# Patient Record
Sex: Female | Born: 1965
Health system: Southern US, Community
[De-identification: ages and names within clinical notes are randomized; demographics above are authoritative.]

---

## 2013-07-15 ENCOUNTER — Ambulatory Visit: Payer: Self-pay | Admitting: Internal Medicine

## 2019-02-22 ENCOUNTER — Other Ambulatory Visit: Payer: Self-pay

## 2019-02-22 ENCOUNTER — Ambulatory Visit
Admission: EM | Admit: 2019-02-22 | Discharge: 2019-02-22 | Disposition: A | Discharge status: Home / Self Care | Payer: BLUE CROSS/BLUE SHIELD | Attending: Family Medicine | Admitting: Family Medicine

## 2019-02-22 ENCOUNTER — Encounter: Payer: Self-pay | Admitting: Emergency Medicine

## 2019-02-22 ENCOUNTER — Ambulatory Visit: Admission: EM | Admit: 2019-02-22 | Discharge: 2019-02-22 | Discharge status: Absent without leave | Payer: Self-pay

## 2019-02-22 ENCOUNTER — Ambulatory Visit (INDEPENDENT_AMBULATORY_CARE_PROVIDER_SITE_OTHER): Payer: BLUE CROSS/BLUE SHIELD

## 2019-02-22 DIAGNOSIS — W228XXA Striking against or struck by other objects, initial encounter: Secondary | ICD-10-CM

## 2019-02-22 DIAGNOSIS — S92514A Nondisplaced fracture of proximal phalanx of right lesser toe(s), initial encounter for closed fracture: Secondary | ICD-10-CM

## 2019-02-22 DIAGNOSIS — S9031XA Contusion of right foot, initial encounter: Secondary | ICD-10-CM

## 2019-02-22 DIAGNOSIS — M79671 Pain in right foot: Secondary | ICD-10-CM

## 2019-02-22 NOTE — ED Provider Notes (Signed)
[facility] ____________________________________________  Time seen: Approximately 10:28 AM  I have reviewed the triage vital signs and the nursing notes.   HISTORY  Chief Complaint Foot Pain (right APPT) and Fall   HPI Izzie M Selner Zachary Georgeis a 53 y.o. female for evaluation of right foot pain after injury that occurred this morning.  States she was carrying her grandson in the bathroom and her foot slid going underneath her the old-fashioned scale causing pain.  Pain is worse with movement and walking.  Denies alleviating factors.  No pain radiation, paresthesias or other injury.  Denies injury to the same area previously. Reports otherwise doing well.  No recent sickness.    History reviewed. No pertinent past medical history.  There are no active problems to display for this patient.   No current facility-administered medications for this encounter.   Current Outpatient Medications:  Marland Kitchen.  Multiple Vitamins-Iron (MULTIVITAMIN/IRON PO), Take by mouth., Disp: , Rfl:  .  Calcium Carb-Ergocalciferol 500-200 MG-UNIT TABS, Take by mouth., Disp: , Rfl:  .  Iodine Tincture TINC, Apply topically., Disp: , Rfl:   Allergies Patient has no known allergies.  Family History  Problem Relation Age of Onset  . Thrombocytopenia Mother   . Heart failure Father   . Cancer Father        prostate  . Hypertension Father     Social History Social History   Tobacco Use  . Smoking status: Never Smoker  . Smokeless tobacco: Never Used  Substance Use Topics  . Alcohol use: Yes    Alcohol/week: 1.0 standard drinks    Types: 1 Glasses of wine per week  . Drug use: Not Currently    Review of Systems Constitutional: No fever ENT: No sore throat. Cardiovascular: Denies chest pain. Respiratory: Denies shortness of breath. Gastrointestinal: No abdominal pain.  Musculoskeletal: right foot pain. Skin: Negative for rash.   ____________________________________________   PHYSICAL  EXAM:  VITAL SIGNS: ED Triage Vitals  Enc Vitals Group     BP 02/22/19 0925 120/84     Pulse Rate 02/22/19 0925 77     Resp 02/22/19 0925 18     Temp 02/22/19 0925 98.2 F (36.8 C)     Temp Source 02/22/19 0925 Oral     SpO2 02/22/19 0925 100 %     Weight 02/22/19 0920 109 lb (49.4 kg)     Height 02/22/19 0920 5' (1.524 m)     Head Circumference --      Peak Flow --      Pain Score 02/22/19 0919 3     Pain Loc --      Pain Edu? --      Excl. in GC? --     Constitutional: Alert and oriented. Well appearing and in no acute distress. Eyes: Conjunctivae are normal. ENT      Head: Normocephalic and atraumatic. Cardiovascular: Normal rate, regular rhythm. Grossly normal heart sounds.  Good peripheral circulation. Respiratory: Normal respiratory effort without tachypnea nor retractions. Breath sounds are clear and equal bilaterally. No wheezes, rales, rhonchi. Musculoskeletal:  Distal pedal pulses equal.  Right dorsal mid distal foot and to the second and third and fourth proximal toes tenderness to direct palpation with mild ecchymosis, normal to sensation and capillary refill, right foot otherwise nontender. Neurologic:  Normal speech and language.  Skin:  Skin is warm, dry and intact. No rash noted. Psychiatric: Mood and affect are normal. Speech and behavior are normal. Patient exhibits appropriate insight and judgment  ___________________________________________   LABS (all labs ordered are listed, but only abnormal results are displayed)  Labs Reviewed - No data to display ____________________________________________  RADIOLOGY  Dg Foot Complete Right  Result Date: 02/22/2019 CLINICAL DATA:  Right foot pain after fall. EXAM: RIGHT FOOT COMPLETE - 3+ VIEW COMPARISON:  None. FINDINGS: Mildly angulated fracture is seen involving the distal portion of the third proximal phalanx. No other bony abnormality is noted. Joint spaces are intact. No soft tissue abnormality is noted.  IMPRESSION: Mildly angulated third proximal phalangeal fracture. Electronically Signed   By: Marijo Conception M.D.   On: 02/22/2019 09:52   ____________________________________________   PROCEDURES Procedures     INITIAL IMPRESSION / ASSESSMENT AND PLAN / ED COURSE  Pertinent labs & imaging results that were available during my care of the patient were reviewed by me and considered in my medical decision making (see chart for details).  Well-appearing patient.  Right foot pain post mechanical injury.  Right foot x-ray as above per radiologist, mildly angulated third proximal phalangeal fracture. 3/4 Toes buddy tape and postop shoe given.  Encouraged ice, rest, supportive care.  Follow-up with podiatry.  Discussed follow up and return parameters including no resolution or any worsening concerns. Patient verbalized understanding and agreed to plan.   ____________________________________________   FINAL CLINICAL IMPRESSION(S) / ED DIAGNOSES  Final diagnoses:  Closed nondisplaced fracture of proximal phalanx of lesser toe of right foot, initial encounter  Contusion of right foot, initial encounter     ED Discharge Orders    None       Note: This dictation was prepared with Dragon dictation along with smaller phrase technology. Any transcriptional errors that result from this process are unintentional.        Marylene Land, NP 02/22/19 1031

## 2019-02-22 NOTE — ED Triage Notes (Signed)
Pt c/o right foot pain. She states that she tripped and fell this morning. She can not walk on her foot and has decreased ROM in her toes. She states most of the pain is located on top of her foot.

## 2019-02-22 NOTE — Discharge Instructions (Addendum)
Ice. Buddy tape toes. Rest. Elevate.   Follow up with your primary care physician this week as needed. Return to Urgent care for new or worsening concerns.

## 2021-02-06 IMAGING — CR DG FOOT COMPLETE 3+V*R*
4 series · 4 of 4 positions shown · non-contrast
Comparison: None.

CLINICAL DATA: Right foot pain after fall.

EXAM:
RIGHT FOOT COMPLETE - 3+ VIEW

[foot ap]
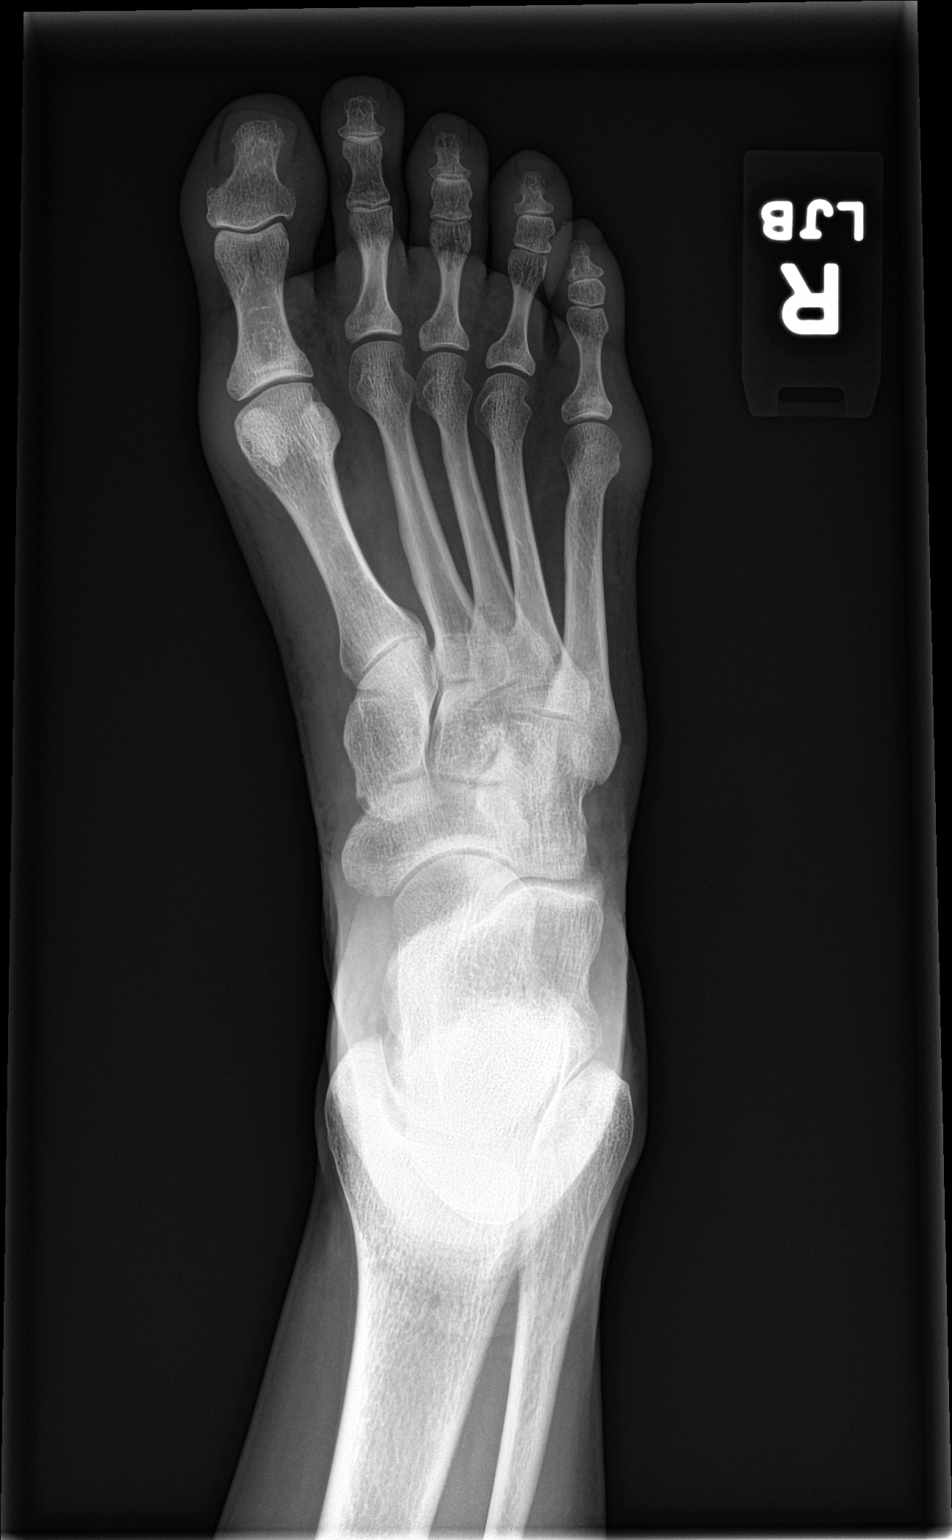

[foot obl]
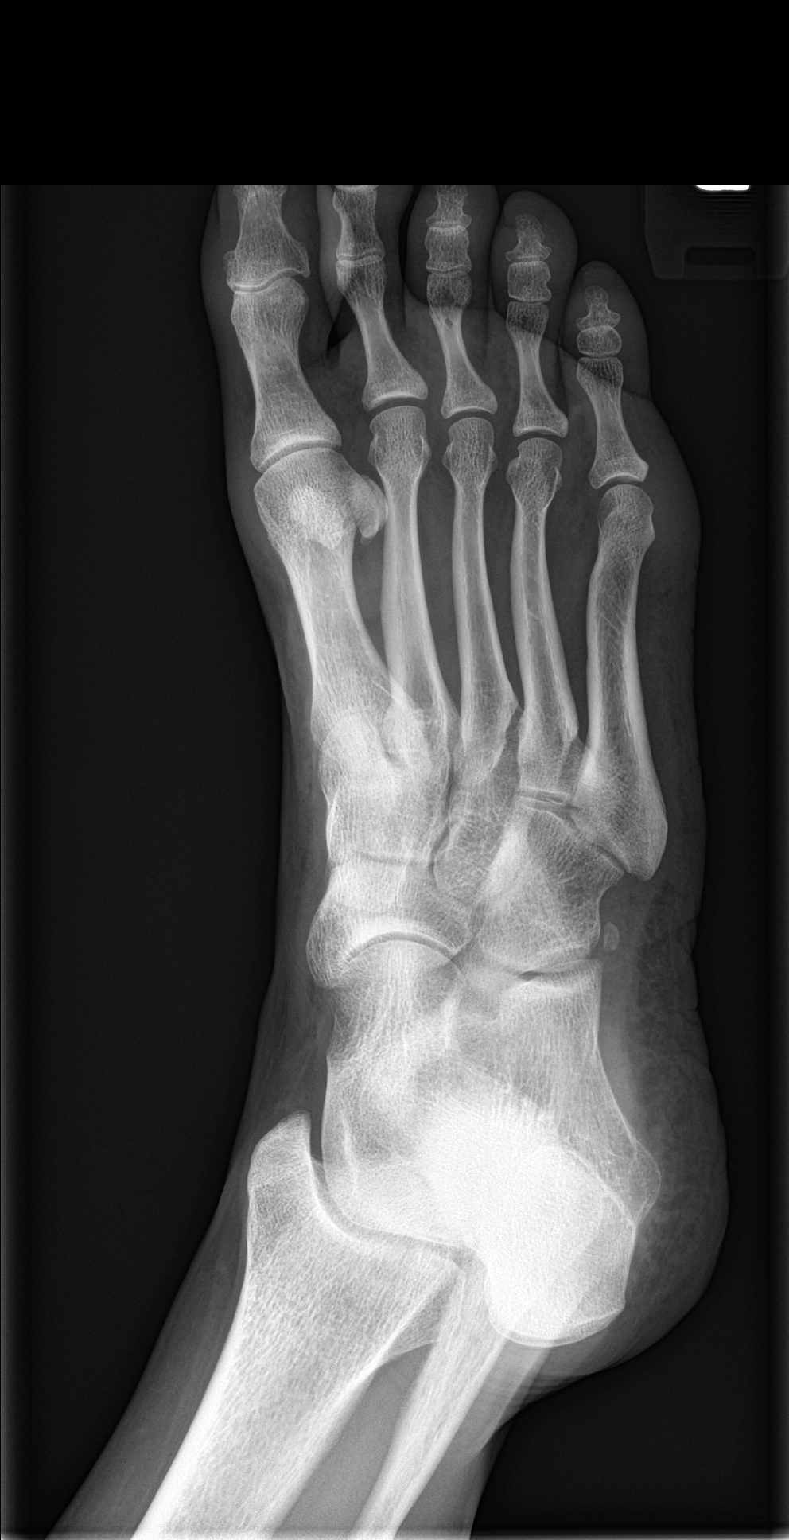

[foot lat (1 of 2)]
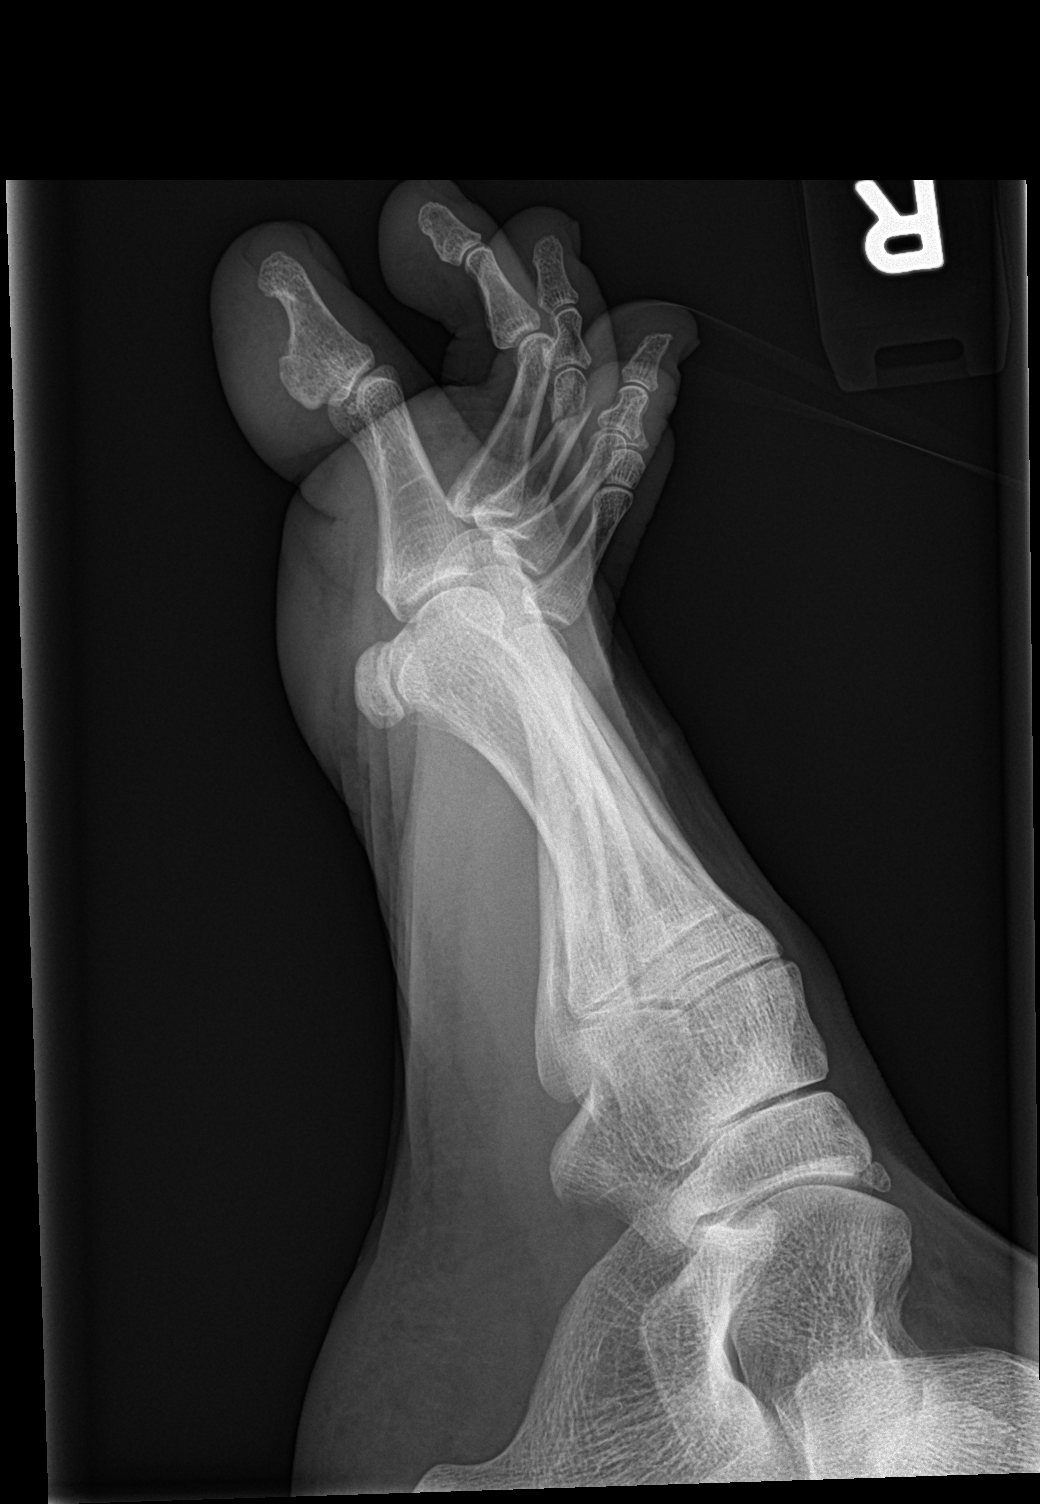

[foot lat (2 of 2)]
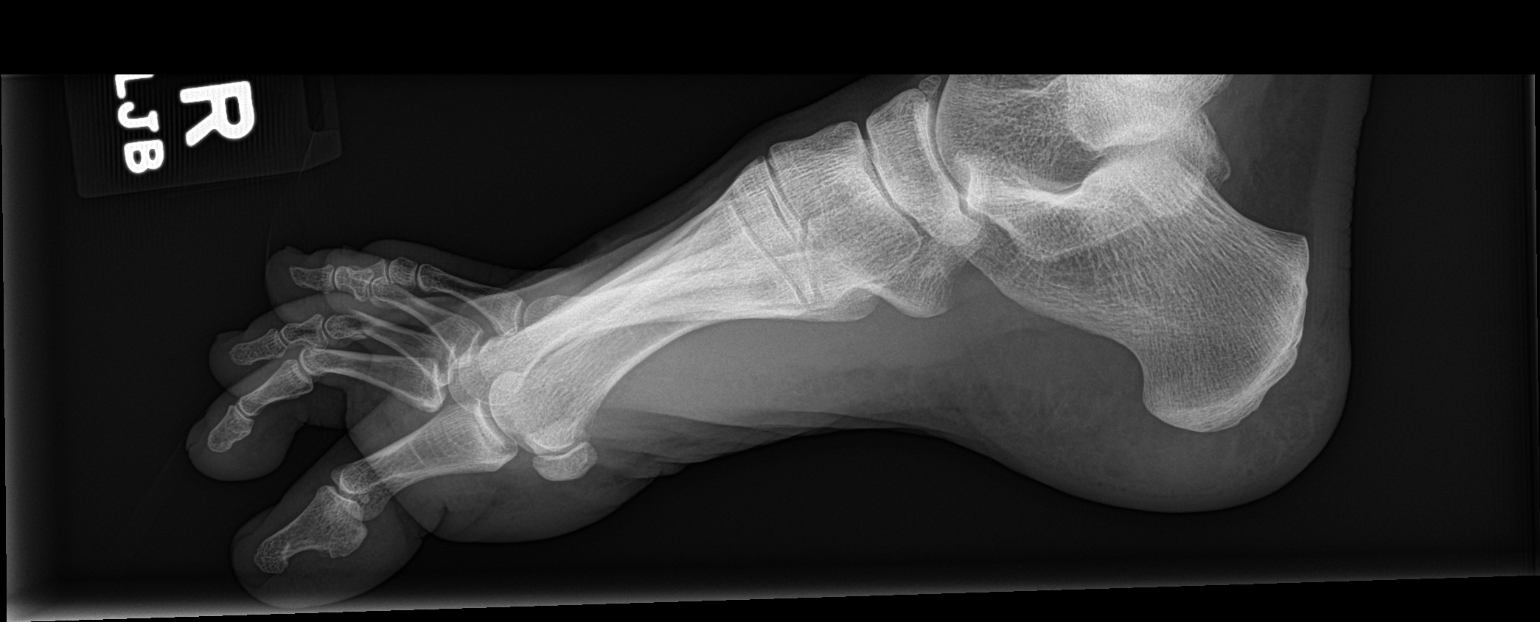

[4 of 4 positions shown; findings below may reference images not displayed]

FINDINGS: Mildly angulated fracture is seen involving the distal portion of
the third proximal phalanx. No other bony abnormality is noted.
Joint spaces are intact. No soft tissue abnormality is noted.
IMPRESSION: Mildly angulated third proximal phalangeal fracture.

## 2023-03-08 ENCOUNTER — Encounter: Payer: Self-pay | Admitting: Emergency Medicine
